# Patient Record
Sex: Female | Born: 2000 | Race: Black or African American | Hispanic: No | Marital: Single | Smoking: Never smoker
Health system: Southern US, Community
[De-identification: ages and names within clinical notes are randomized; demographics above are authoritative.]

---
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
## 2020-03-21 ENCOUNTER — Other Ambulatory Visit: Payer: Self-pay

## 2020-03-21 DIAGNOSIS — Z20822 Contact with and (suspected) exposure to covid-19: Secondary | ICD-10-CM

## 2020-03-23 LAB — SARS-COV-2, NAA 2 DAY TAT

## 2020-03-23 LAB — NOVEL CORONAVIRUS, NAA: SARS-CoV-2, NAA: DETECTED — AB

## 2020-07-29 ENCOUNTER — Emergency Department (HOSPITAL_COMMUNITY)
Admission: EM | Admit: 2020-07-29 | Discharge: 2020-07-30 | Disposition: A | Discharge status: Home / Self Care | Payer: BC Managed Care – PPO | Attending: Emergency Medicine | Admitting: Emergency Medicine

## 2020-07-29 ENCOUNTER — Other Ambulatory Visit: Payer: Self-pay

## 2020-07-29 ENCOUNTER — Emergency Department (HOSPITAL_COMMUNITY): Payer: BC Managed Care – PPO

## 2020-07-29 ENCOUNTER — Encounter (HOSPITAL_COMMUNITY): Payer: Self-pay

## 2020-07-29 DIAGNOSIS — Z20822 Contact with and (suspected) exposure to covid-19: Secondary | ICD-10-CM | POA: Insufficient documentation

## 2020-07-29 DIAGNOSIS — B349 Viral infection, unspecified: Secondary | ICD-10-CM | POA: Diagnosis not present

## 2020-07-29 DIAGNOSIS — Z8616 Personal history of COVID-19: Secondary | ICD-10-CM | POA: Diagnosis not present

## 2020-07-29 DIAGNOSIS — R509 Fever, unspecified: Secondary | ICD-10-CM | POA: Diagnosis present

## 2020-07-29 MED ORDER — IBUPROFEN 400 MG PO TABS
600.0000 mg | ORAL_TABLET | Freq: Once | ORAL | Status: AC
  Administered 2020-07-29: 600 mg via ORAL

## 2020-07-29 NOTE — ED Triage Notes (Signed)
Patient reports fever, nasal congestion, body aches x 2 days, reports fever as high as 105 tonight, reports she had covid 3 weeks ago, vaccinated and boosted

## 2020-07-29 NOTE — ED Provider Notes (Signed)
MSE was initiated and I personally evaluated the patient and placed orders (if any) at  11:10 PM on Jul 29, 2020.  Started feeling unwell yesterday, better this morning. Later onset of chills.  Reports Tmax oral 105.2. Took ibuprofen and was 103 on arrival. Some congestion, mild cough that is productive. Some nausea without vomiting. +Myalgias. No COVID exposures, fully vaccinated. Had COVID 3 weeks ago.   Lungs clear, Tachycardic  The patient appears stable so that the remainder of the MSE may be completed by another provider.   Elpidio Anis, PA-C 07/29/20 2313    Gilda Crease, MD 07/30/20 [phone]

## 2020-07-30 LAB — URINALYSIS, ROUTINE W REFLEX MICROSCOPIC
Bilirubin Urine: NEGATIVE
Glucose, UA: NEGATIVE mg/dL
Hgb urine dipstick: NEGATIVE
Ketones, ur: 20 mg/dL — AB
Leukocytes,Ua: NEGATIVE
Nitrite: NEGATIVE
Protein, ur: NEGATIVE mg/dL
Specific Gravity, Urine: 1.021 (ref 1.005–1.030)
pH: 8 (ref 5.0–8.0)

## 2020-07-30 LAB — CBC WITH DIFFERENTIAL/PLATELET
Abs Immature Granulocytes: 0.02 10*3/uL (ref 0.00–0.07)
Basophils Absolute: 0 10*3/uL (ref 0.0–0.1)
Basophils Relative: 0 %
Eosinophils Absolute: 0 10*3/uL (ref 0.0–0.5)
Eosinophils Relative: 0 %
HCT: 39.1 % (ref 36.0–46.0)
Hemoglobin: 12.2 g/dL (ref 12.0–15.0)
Immature Granulocytes: 0 %
Lymphocytes Relative: 7 %
Lymphs Abs: 0.5 10*3/uL — ABNORMAL LOW (ref 0.7–4.0)
MCH: 24 pg — ABNORMAL LOW (ref 26.0–34.0)
MCHC: 31.2 g/dL (ref 30.0–36.0)
MCV: 77 fL — ABNORMAL LOW (ref 80.0–100.0)
Monocytes Absolute: 0.5 10*3/uL (ref 0.1–1.0)
Monocytes Relative: 9 %
Neutro Abs: 5.2 10*3/uL (ref 1.7–7.7)
Neutrophils Relative %: 84 %
Platelets: 263 10*3/uL (ref 150–400)
RBC: 5.08 MIL/uL (ref 3.87–5.11)
RDW: 15.1 % (ref 11.5–15.5)
WBC: 6.3 10*3/uL (ref 4.0–10.5)
nRBC: 0 % (ref 0.0–0.2)

## 2020-07-30 LAB — COMPREHENSIVE METABOLIC PANEL
ALT: 14 U/L (ref 0–44)
AST: 23 U/L (ref 15–41)
Albumin: 3.8 g/dL (ref 3.5–5.0)
Alkaline Phosphatase: 64 U/L (ref 38–126)
Anion gap: 11 (ref 5–15)
BUN: 8 mg/dL (ref 6–20)
CO2: 22 mmol/L (ref 22–32)
Calcium: 9.2 mg/dL (ref 8.9–10.3)
Chloride: 102 mmol/L (ref 98–111)
Creatinine, Ser: 1.07 mg/dL — ABNORMAL HIGH (ref 0.44–1.00)
GFR, Estimated: >60 mL/min (ref >60)
Glucose, Bld: 99 mg/dL (ref 70–99)
Potassium: 3.6 mmol/L (ref 3.5–5.1)
Sodium: 135 mmol/L (ref 135–145)
Total Bilirubin: 0.8 mg/dL (ref 0.3–1.2)
Total Protein: 7.6 g/dL (ref 6.5–8.1)

## 2020-07-30 LAB — LACTIC ACID, PLASMA: Lactic Acid, Venous: 1.2 mmol/L (ref 0.5–1.9)

## 2020-07-30 LAB — RESP PANEL BY RT-PCR (FLU A&B, COVID) ARPGX2
Influenza A by PCR: NEGATIVE
Influenza B by PCR: NEGATIVE
SARS Coronavirus 2 by RT PCR: NEGATIVE

## 2020-07-30 NOTE — ED Provider Notes (Signed)
[facility] EMERGENCY DEPARTMENT Provider Note   CSN: [id number] Arrival date & time: 07/29/20  2234     History Chief Complaint  Patient presents with  . Fever  . Generalized Body Aches  . Nasal Congestion    Heidi Marshall is a 20 y.o. female.  Patient reports that she has been experiencing fever, nasal congestion, and cough.  Symptoms began yesterday, worsened today.  She feels achy all over.  Patient reports that she was recently diagnosed with COVID, approximately 3 weeks ago.  She has been fully vaccinated for COVID previously.        No past medical history on file.  There are no problems to display for this patient.      OB History   No obstetric history on file.     No family history on file.  Social History   Tobacco Use  . Smoking status: Never Smoker  . Smokeless tobacco: Never Used  Substance Use Topics  . Alcohol use: Never  . Drug use: Never    Home Medications Prior to Admission medications   Not on File    Allergies    Patient has no known allergies.  Review of Systems   Review of Systems  Constitutional: Positive for fatigue and fever.  HENT: Positive for congestion.   Respiratory: Positive for cough. Negative for shortness of breath.   Musculoskeletal: Positive for myalgias.  All other systems reviewed and are negative.   Physical Exam Updated Vital Signs BP 103/73 (BP Location: Left Arm)   Pulse (!) 125   Temp (!) 103.4 F (39.7 C) (Oral)   Resp 19   SpO2 98%   Physical Exam Vitals and nursing note reviewed.  Constitutional:      General: She is not in acute distress.    Appearance: Normal appearance. She is well-developed.  HENT:     Head: Normocephalic and atraumatic.     Right Ear: Hearing normal.     Left Ear: Hearing normal.     Nose: Nose normal.  Eyes:     Conjunctiva/sclera: Conjunctivae normal.     Pupils: Pupils are equal, round, and reactive to light.  Cardiovascular:     Rate and  Rhythm: Regular rhythm.     Heart sounds: S1 normal and S2 normal. No murmur heard. No friction rub. No gallop.   Pulmonary:     Effort: Pulmonary effort is normal. No respiratory distress.     Breath sounds: Normal breath sounds.  Chest:     Chest wall: No tenderness.  Abdominal:     General: Bowel sounds are normal.     Palpations: Abdomen is soft.     Tenderness: There is no abdominal tenderness. There is no guarding or rebound. Negative signs include Murphy's sign and McBurney's sign.     Hernia: No hernia is present.  Musculoskeletal:        General: Normal range of motion.     Cervical back: Normal range of motion and neck supple.  Skin:    General: Skin is warm and dry.     Findings: No rash.  Neurological:     Mental Status: She is alert and oriented to person, place, and time.     GCS: GCS eye subscore is 4. GCS verbal subscore is 5. GCS motor subscore is 6.     Cranial Nerves: No cranial nerve deficit.     Sensory: No sensory deficit.     Coordination: Coordination normal.  Psychiatric:  Speech: Speech normal.        Behavior: Behavior normal.        Thought Content: Thought content normal.     ED Results / Procedures / Treatments   Labs (all labs ordered are listed, but only abnormal results are displayed) Labs Reviewed  CBC WITH DIFFERENTIAL/PLATELET - Abnormal; Notable for the following components:      Result Value   MCV 77.0 (*)    MCH 24.0 (*)    Lymphs Abs 0.5 (*)    All other components within normal limits  COMPREHENSIVE METABOLIC PANEL - Abnormal; Notable for the following components:   Creatinine, Ser 1.07 (*)    All other components within normal limits  URINALYSIS, ROUTINE W REFLEX MICROSCOPIC - Abnormal; Notable for the following components:   APPearance HAZY (*)    Ketones, ur 20 (*)    All other components within normal limits  RESP PANEL BY RT-PCR (FLU A&B, COVID) ARPGX2  URINE CULTURE  CULTURE, BLOOD (ROUTINE X 2)  CULTURE, BLOOD  (ROUTINE X 2)  LACTIC ACID, PLASMA  LACTIC ACID, PLASMA    EKG None  Radiology DG Chest 2 View  Result Date: 07/29/2020 CLINICAL DATA:  Cough and fever.  Body aches. EXAM: CHEST - 2 VIEW COMPARISON:  None. FINDINGS: The cardiomediastinal contours are normal. The lungs are clear. Pulmonary vasculature is normal. No consolidation, pleural effusion, or pneumothorax. No acute osseous abnormalities are seen. IMPRESSION: Negative radiographs of the chest. Electronically Signed   By: Narda Rutherford M.D.   On: 07/29/2020 23:58    Procedures Procedures   Medications Ordered in ED Medications  ibuprofen (ADVIL) tablet 600 mg (600 mg Oral Given 07/29/20 2319)    ED Course  I have reviewed the triage vital signs and the nursing notes.  Pertinent labs & imaging results that were available during my care of the patient were reviewed by me and considered in my medical decision making (see chart for details).    MDM Rules/Calculators/A&P                          Patient appears well.  No meningismus.  Does not appear toxic.  Lab work reassuring.  No leukocytosis.  Tachycardia likely related to fever.  As she has defervesced, heart rate coming down.  No vomiting or diarrhea.  Abdominal exam benign.  Urinalysis does not suggest infection.  Chest x-ray is clear, no evidence of pneumonia.  COVID and influenza are negative.  Symptoms likely secondary to unknown viral etiology.  Recommend symptomatic treatment at home.  Final Clinical Impression(s) / ED Diagnoses Final diagnoses:  Viral illness    Rx / DC Orders ED Discharge Orders    None       Larenz Frasier, Canary Brim, MD 07/30/20 0155

## 2020-07-31 LAB — URINE CULTURE: Culture: 30000 — AB

## 2020-08-01 ENCOUNTER — Telehealth: Payer: Self-pay | Admitting: Emergency Medicine

## 2020-08-01 NOTE — Telephone Encounter (Signed)
Post ED Visit - Positive Culture Follow-up  Culture report reviewed by antimicrobial stewardship pharmacist: [facility] []  , Pharm.D. []  Enzo Bi, Pharm.D., BCPS AQ-ID []  , Pharm.D., BCPS []  Celedonio Miyamoto, Pharm.D., BCPS []  Pender, Garvin Fila.D., BCPS, AAHIVP []  , Pharm.D., BCPS, AAHIVP []  Georgina Pillion, PharmD, BCPS []  , PharmD, BCPS []  [place], PharmD, BCPS []  [address], PharmD []  , PharmD, BCPS []  Estella Husk, PharmD  Pharmacy Team []  Lysle Pearl, PharmD []  , PharmD []  Phillips Climes, PharmD []  , Rph []  Agapito Games) , PharmD []  Verlan Friends, PharmD []  , PharmD []  Mervyn Gay, PharmD []  , PharmD []  Vinnie Level, PharmD []  [facility], PharmD []  , PharmD []  Len Childs, PharmD   Positive urine culture Treated with none, asymptomatic, no further patient follow-up is required at this time.  08/01/2020, 9:05 AM

## 2020-08-04 LAB — CULTURE, BLOOD (ROUTINE X 2)
Culture: NO GROWTH
Culture: NO GROWTH
Special Requests: ADEQUATE

## 2021-05-19 ENCOUNTER — Emergency Department (HOSPITAL_COMMUNITY)
Admission: EM | Admit: 2021-05-19 | Discharge: 2021-05-19 | Disposition: A | Discharge status: Home / Self Care | Payer: BC Managed Care – PPO | Attending: Emergency Medicine | Admitting: Emergency Medicine

## 2021-05-19 ENCOUNTER — Other Ambulatory Visit: Payer: Self-pay

## 2021-05-19 ENCOUNTER — Emergency Department (HOSPITAL_COMMUNITY): Payer: BC Managed Care – PPO

## 2021-05-19 ENCOUNTER — Encounter (HOSPITAL_COMMUNITY): Payer: Self-pay | Admitting: Emergency Medicine

## 2021-05-19 DIAGNOSIS — Y92410 Unspecified street and highway as the place of occurrence of the external cause: Secondary | ICD-10-CM | POA: Diagnosis not present

## 2021-05-19 DIAGNOSIS — M25512 Pain in left shoulder: Secondary | ICD-10-CM | POA: Insufficient documentation

## 2021-05-19 DIAGNOSIS — S199XXA Unspecified injury of neck, initial encounter: Secondary | ICD-10-CM | POA: Diagnosis present

## 2021-05-19 DIAGNOSIS — S161XXA Strain of muscle, fascia and tendon at neck level, initial encounter: Secondary | ICD-10-CM

## 2021-05-19 MED ORDER — METHOCARBAMOL 500 MG PO TABS: 500.0000 mg | ORAL_TABLET | Freq: Two times a day (BID) | ORAL | 0 refills | Status: AC

## 2021-05-19 MED ORDER — NAPROXEN 250 MG PO TABS
375.0000 mg | ORAL_TABLET | Freq: Once | ORAL | Status: AC
  Administered 2021-05-19: 375 mg via ORAL
  Filled 2021-05-19: qty 2

## 2021-05-19 MED ORDER — NAPROXEN 375 MG PO TABS: 375.0000 mg | ORAL_TABLET | Freq: Two times a day (BID) | ORAL | 0 refills | Status: AC

## 2021-05-19 MED ORDER — METHOCARBAMOL 500 MG PO TABS
500.0000 mg | ORAL_TABLET | Freq: Once | ORAL | Status: AC
  Administered 2021-05-19: 500 mg via ORAL
  Filled 2021-05-19: qty 1
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
## 2021-05-19 NOTE — ED Notes (Signed)
Called for room transfer, no response.  ?
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
